# Patient Record
Sex: Female | Born: 1950 | Race: White | Hispanic: No | Marital: Single | Smoking: Never smoker
Health system: Southern US, Community
[De-identification: ages and names within clinical notes are randomized; demographics above are authoritative.]

## PROBLEM LIST (undated history)

## (undated) DIAGNOSIS — C801 Malignant (primary) neoplasm, unspecified: Secondary | ICD-10-CM

## (undated) DIAGNOSIS — E669 Obesity, unspecified: Secondary | ICD-10-CM

## (undated) HISTORY — DX: Malignant (primary) neoplasm, unspecified: C80.1

## (undated) HISTORY — PX: PORT-A-CATH REMOVAL: SHX5289

## (undated) HISTORY — DX: Obesity, unspecified: E66.9

---

## 2009-08-28 HISTORY — PX: PORTACATH PLACEMENT: SHX2246

## 2009-08-28 HISTORY — PX: BREAST SURGERY: SHX581

## 2010-07-01 ENCOUNTER — Ambulatory Visit: Payer: Self-pay | Admitting: Cardiology

## 2010-12-14 ENCOUNTER — Other Ambulatory Visit (HOSPITAL_COMMUNITY): Payer: Self-pay | Admitting: Internal Medicine

## 2010-12-14 DIAGNOSIS — C50911 Malignant neoplasm of unspecified site of right female breast: Secondary | ICD-10-CM

## 2010-12-16 ENCOUNTER — Ambulatory Visit
Admission: RE | Admit: 2010-12-16 | Discharge: 2010-12-16 | Disposition: A | Discharge status: Home / Self Care | Payer: PRIVATE HEALTH INSURANCE | Source: Ambulatory Visit | Attending: Internal Medicine | Admitting: Internal Medicine

## 2010-12-16 DIAGNOSIS — C50911 Malignant neoplasm of unspecified site of right female breast: Secondary | ICD-10-CM

## 2010-12-16 MED ORDER — GADOBENATE DIMEGLUMINE 529 MG/ML IV SOLN
20.0000 mL | Freq: Once | INTRAVENOUS | Status: AC | PRN
  Administered 2010-12-16: 20 mL via INTRAVENOUS

## 2010-12-22 ENCOUNTER — Other Ambulatory Visit (HOSPITAL_COMMUNITY): Payer: Self-pay | Admitting: General Surgery

## 2010-12-22 ENCOUNTER — Encounter (HOSPITAL_COMMUNITY)
Admission: RE | Admit: 2010-12-22 | Discharge: 2010-12-22 | Disposition: A | Discharge status: Home / Self Care | Payer: PRIVATE HEALTH INSURANCE | Source: Ambulatory Visit | Attending: General Surgery | Admitting: General Surgery

## 2010-12-22 ENCOUNTER — Ambulatory Visit (HOSPITAL_COMMUNITY)
Admission: RE | Admit: 2010-12-22 | Discharge: 2010-12-22 | Disposition: A | Discharge status: Home / Self Care | Payer: PRIVATE HEALTH INSURANCE | Source: Ambulatory Visit | Attending: General Surgery | Admitting: General Surgery

## 2010-12-22 DIAGNOSIS — C50919 Malignant neoplasm of unspecified site of unspecified female breast: Secondary | ICD-10-CM

## 2010-12-22 DIAGNOSIS — Z01812 Encounter for preprocedural laboratory examination: Secondary | ICD-10-CM | POA: Insufficient documentation

## 2010-12-22 DIAGNOSIS — Z01818 Encounter for other preprocedural examination: Secondary | ICD-10-CM | POA: Insufficient documentation

## 2010-12-22 DIAGNOSIS — Z01810 Encounter for preprocedural cardiovascular examination: Secondary | ICD-10-CM | POA: Insufficient documentation

## 2010-12-22 LAB — CBC
HCT: 36.4 % (ref 36.0–46.0)
Hemoglobin: 12.3 g/dL (ref 12.0–15.0)
MCHC: 33.8 g/dL (ref 30.0–36.0)
WBC: 9.8 10*3/uL (ref 4.0–10.5)

## 2010-12-22 LAB — COMPREHENSIVE METABOLIC PANEL
AST: 29 U/L (ref 0–37)
Albumin: 3.5 g/dL (ref 3.5–5.2)
Alkaline Phosphatase: 74 U/L (ref 39–117)
Chloride: 103 mEq/L (ref 96–112)
GFR calc Af Amer: >60 mL/min (ref >60)
Potassium: 4 mEq/L (ref 3.5–5.1)
Total Bilirubin: 0.7 mg/dL (ref 0.3–1.2)
Total Protein: 5.9 g/dL — ABNORMAL LOW (ref 6.0–8.3)

## 2010-12-22 LAB — DIFFERENTIAL
Basophils Absolute: 0 10*3/uL (ref 0.0–0.1)
Basophils Relative: 0 % (ref 0–1)
Lymphocytes Relative: 15 % (ref 12–46)
Neutro Abs: 7.6 10*3/uL (ref 1.7–7.7)
Neutrophils Relative %: 77 % (ref 43–77)

## 2010-12-22 LAB — URINALYSIS, ROUTINE W REFLEX MICROSCOPIC
Bilirubin Urine: NEGATIVE
Glucose, UA: NEGATIVE mg/dL
Ketones, ur: NEGATIVE mg/dL
Protein, ur: NEGATIVE mg/dL
pH: 7.5 (ref 5.0–8.0)

## 2010-12-22 LAB — URINE MICROSCOPIC-ADD ON

## 2010-12-22 LAB — SURGICAL PCR SCREEN: MRSA, PCR: NEGATIVE

## 2010-12-27 ENCOUNTER — Other Ambulatory Visit: Payer: Self-pay | Admitting: General Surgery

## 2010-12-27 ENCOUNTER — Observation Stay (HOSPITAL_COMMUNITY)
Admission: RE | Admit: 2010-12-27 | Discharge: 2010-12-28 | Disposition: A | Discharge status: Home / Self Care | Payer: PRIVATE HEALTH INSURANCE | Source: Ambulatory Visit | Attending: General Surgery | Admitting: General Surgery

## 2010-12-27 DIAGNOSIS — Z79899 Other long term (current) drug therapy: Secondary | ICD-10-CM | POA: Insufficient documentation

## 2010-12-27 DIAGNOSIS — C50019 Malignant neoplasm of nipple and areola, unspecified female breast: Principal | ICD-10-CM | POA: Insufficient documentation

## 2011-01-02 ENCOUNTER — Encounter (INDEPENDENT_AMBULATORY_CARE_PROVIDER_SITE_OTHER): Payer: Self-pay | Admitting: General Surgery

## 2011-01-02 NOTE — Op Note (Signed)
NAMEADREA, Miranda Parker               ACCOUNT NO.:  [id number]  MEDICAL RECORD NO.:  [id number]           PATIENT TYPE:  O  LOCATION:  5148                         FACILITY:  [facility]  PHYSICIAN:  Angelia Mould. Derrell Lolling, M.D.DATE OF BIRTH:  1950-11-08  DATE OF PROCEDURE:  12/27/2010 DATE OF DISCHARGE:                              OPERATIVE REPORT   PREOPERATIVE DIAGNOSES: 1. Invasive carcinoma, right breast, clinical stage T3N0. 2. Status post Port-A-Cath insertion.  POSTOPERATIVE DIAGNOSES: 1. Invasive carcinoma, right breast, clinical stage T3N0. 2. Status post Port-A-Cath insertion.  OPERATIONS PERFORMED: 1. Right modified radical mastectomy. 2. Removal of Port-A-Cath.  SURGEON:  Angelia Mould. Derrell Lolling, MD  FIRST ASSISTANT:  Anselm Pancoast. Zachery Dakins, MD  OPERATIVE INDICATIONS:  This is a 60 year old Caucasian female who states she felt a lump in her right breast for 3 years or so.  When the skin got red, she went to the emergency room in October 2011.  Cancer was suspected.  She saw Dr. Ubaldo Glassing.  Mammograms performed on June 14, 2010 showed an 8-cm right periareolar mass and some questionable shadowing at the 12 o'clock position on the left breast.  Bilateral image-guided biopsy showed invasive ductal carcinoma of the right breast which was ER/PR strongly positive and HER2 negative.  The left breast biopsy revealed no malignancy.  Port-A-Cath was placed up in [place] and she has undergone neoadjuvant chemotherapy.  The tumor has been down- staged to somewhat and the skin redness has gone away.  Dr. Ubaldo Glassing referred her to me.  On exam in the office recently, she had about a 6- cm mass centrally in the breast behind and just above the areola.  It had destroyed and invaded the superior half of the areola.  The skin looked healthy elsewhere.  There was no palpable mass in the left breast or in either axilla.  At the end of treatment, MRI showed a 6.3 cm area of enhancement in the right  breast corresponding to the known cancer. There was no abnormal adenopathy anywhere.  The left breast looked normal.  She was advised to proceed with definitive surgery and was brought to the operating room for a right modified radical mastectomy. We are also going to remove the Port-A-Cath since it is imbedded within the breast tissue and this has been discussed with Dr. Ubaldo Glassing.  OPERATIVE TECHNIQUE:  Following the induction of general endotracheal anesthesia, the patient's neck, chest, and lateral chest wall and arm and axilla were then prepped and draped in a sterile fashion. Intravenous antibiotics were given.  Surgical time-out was held identifying correct patient, correct procedure, and correct site.  I used a marking pen to mark a transverse elliptical incision taking generous skin margins both above and below.  The incision was made with a knife.  Skin flaps were raised superiorly to just below the clavicle, medially to the parasternal area, inferiorly to the inframammary crease and laterally to the platysmas dorsi muscle.  Superiorly, I encountered the port and was able to dissect the catheter free and removed that from the subclavian vein.  The port was deeply imbedded within the breast tissue and so I  simply left that in place.  The breast was dissected off the pectoralis major and minor muscles using electrocautery and dissecting from superior to inferior.  We were able to do this and leave the muscles intact.  The dissection was carried out laterally.  With retraction of the pectoralis major and minor muscles, we then incised the clavipectoral fascia and entered the axilla.  We then took the dissection up to the axillary vein which we identified.  We dissected all of the axillary tissue below the axillary vein.  There was some superficial venous tributaries which were controlled with metal clips and divided.  As we got deeper, there were a couple of sensory nerves which  were controlled with metal clips and divided.  When we got deeper, we identified the thoracodorsal neurovascular bundle.  This was preserved throughout the case and at the completion of the procedure the thoracodorsal nerve was functioning.  We swept all of the axillary contents out of the axilla en bloc, A, B, and C.  We were able to palpate and stimulate the long thoracic nerve and it seemed to be working at the completion of the case.  We completed the dissection of the axillary contents.  We marked the lateral skin margin with a silk suture.  We sent the breast and the axillary contents together as one specimen.  There were few other lymphatic tissues up in the axilla.  I dissected those out and sent those separately.  The wound was irrigated with saline.  Hemostasis was excellent and had been achieved with electrocautery and metal clips.  Two 19-French Blake drains were placed, one up across the skin flaps and one up into the axilla and these were brought out through separate stab incisions inferolaterally, sutured to the skin with nylon sutures and connected to suction bulbs.  The skin was healthy and was closed with skin staples.  A clean occlusive bandage was placed and the breast binder was placed and the patient taken to the recovery room in stable condition.  Estimated blood loss was about 100- 150 mL.  Complications none.  Sponge, needle, and instrument counts were correct.     Angelia Mould. Derrell Lolling, M.D.     HMI/MEDQ  D:  12/27/2010  T:  12/27/2010  Job:  [id number]  cc:   Lebron Conners. Darovsky, M.D.  Electronically Signed by Claud Kelp M.D. on 01/02/2011 06:40:59 PM

## 2011-02-07 ENCOUNTER — Ambulatory Visit (HOSPITAL_COMMUNITY)
Admission: RE | Admit: 2011-02-07 | Discharge: 2011-02-07 | Disposition: A | Discharge status: Home / Self Care | Payer: PRIVATE HEALTH INSURANCE | Source: Ambulatory Visit | Attending: General Surgery | Admitting: General Surgery

## 2011-02-07 DIAGNOSIS — M7989 Other specified soft tissue disorders: Secondary | ICD-10-CM | POA: Insufficient documentation

## 2011-02-22 ENCOUNTER — Ambulatory Visit: Payer: PRIVATE HEALTH INSURANCE | Attending: General Surgery | Admitting: Physical Therapy

## 2011-02-22 DIAGNOSIS — I890 Lymphedema, not elsewhere classified: Secondary | ICD-10-CM | POA: Insufficient documentation

## 2011-02-22 DIAGNOSIS — IMO0001 Reserved for inherently not codable concepts without codable children: Secondary | ICD-10-CM | POA: Insufficient documentation

## 2011-02-27 ENCOUNTER — Ambulatory Visit: Payer: PRIVATE HEALTH INSURANCE | Attending: General Surgery | Admitting: Physical Therapy

## 2011-02-27 DIAGNOSIS — I890 Lymphedema, not elsewhere classified: Secondary | ICD-10-CM | POA: Insufficient documentation

## 2011-02-27 DIAGNOSIS — IMO0001 Reserved for inherently not codable concepts without codable children: Secondary | ICD-10-CM | POA: Insufficient documentation

## 2011-03-02 ENCOUNTER — Ambulatory Visit: Payer: PRIVATE HEALTH INSURANCE | Admitting: Physical Therapy

## 2011-03-07 ENCOUNTER — Ambulatory Visit: Payer: PRIVATE HEALTH INSURANCE | Admitting: Physical Therapy

## 2011-03-09 ENCOUNTER — Ambulatory Visit: Payer: PRIVATE HEALTH INSURANCE | Admitting: Physical Therapy

## 2011-03-13 ENCOUNTER — Encounter (INDEPENDENT_AMBULATORY_CARE_PROVIDER_SITE_OTHER): Payer: Self-pay | Admitting: General Surgery

## 2011-03-13 ENCOUNTER — Ambulatory Visit (INDEPENDENT_AMBULATORY_CARE_PROVIDER_SITE_OTHER): Payer: PRIVATE HEALTH INSURANCE | Admitting: General Surgery

## 2011-03-13 DIAGNOSIS — C50919 Malignant neoplasm of unspecified site of unspecified female breast: Secondary | ICD-10-CM

## 2011-03-13 DIAGNOSIS — C50911 Malignant neoplasm of unspecified site of right female breast: Secondary | ICD-10-CM

## 2011-03-13 NOTE — Progress Notes (Signed)
Subjective:     Patient ID: Miranda Parker, female   DOB: 1951-07-26, 60 y.o.   MRN: [id number]  HPI Patient returns for followup. She . The pathology report shows invasive cancer the overlying skin, and cancer being present in dermal lymphatics. The stages is ypT2, ypN0.  She is starting radiation therapy under the guidance of Dr. Roselind Messier today.She has had no problems with wound healing. She has good range of motion of her right shoulder. Her venous Doppler of the right upper extremity was negative. She has been seen in the lymphedema clinic. I signed a prescription for a lymphedema sleeve but she says the swelling is getting better and she may not use it.  Review of Systems  HENT: Positive for tinnitus.    Review of Systems  HENT: Positive for tinnitus.       Objective:   Physical Exam The patient looks well. She is in good spirits.  Neck reveals no adenopathy or mass.  Chest reveals clear lung fields. The Port-A-Cath incision in the right infraclavicular area is well healed.  Breasts the right mastectomy incision is well healed. Tissues are soft. There is no fluid collection, ulceration, or nodules. The right axilla feels normal.  Right arm swelling is less and is nontender without any sign of infection.    Assessment:     1. Locally advanced cancer of the right breast involving dermal lymphatics and areola, status post neoadjuvant chemotherapy and subsequent right modified radical mastectomy with negative margins and negative nodes.  2. Lymphedema right upper extremity, improving spontaneously.  3. No evidence of deep venous thrombosis right upper extremity      Plan:     Proceed with adjuvant radiation therapy.  Return to see me in 4-5 months for physical exam.  Plan left breast mammogram in April 2013.

## 2011-03-13 NOTE — Patient Instructions (Signed)
Your wounds look well healed today. I think that you should continue with your radiation therapy as outlined by Dr. Roselind Messier  Return to see me in 4-5 months for a physical exam.  You should get a left mammogram in April 2013.

## 2011-03-14 ENCOUNTER — Ambulatory Visit: Payer: PRIVATE HEALTH INSURANCE | Admitting: Physical Therapy

## 2011-04-12 ENCOUNTER — Ambulatory Visit: Payer: PRIVATE HEALTH INSURANCE | Attending: General Surgery | Admitting: Physical Therapy

## 2011-04-12 DIAGNOSIS — IMO0001 Reserved for inherently not codable concepts without codable children: Secondary | ICD-10-CM | POA: Insufficient documentation

## 2011-04-12 DIAGNOSIS — I890 Lymphedema, not elsewhere classified: Secondary | ICD-10-CM | POA: Insufficient documentation

## 2011-07-24 ENCOUNTER — Ambulatory Visit (INDEPENDENT_AMBULATORY_CARE_PROVIDER_SITE_OTHER): Payer: PRIVATE HEALTH INSURANCE | Admitting: General Surgery

## 2011-07-24 ENCOUNTER — Encounter (INDEPENDENT_AMBULATORY_CARE_PROVIDER_SITE_OTHER): Payer: Self-pay | Admitting: General Surgery

## 2011-07-24 VITALS — BP 146/90 | HR 72 | Temp 98.7°F | Resp 18 | Ht 64.0 in | Wt 283.2 lb

## 2011-07-24 DIAGNOSIS — Z853 Personal history of malignant neoplasm of breast: Secondary | ICD-10-CM

## 2011-07-25 NOTE — Progress Notes (Signed)
Patient ID: Miranda Parker, female   DOB: [birth year], 60 y.o.   MRN: [id number]  Chief Complaint  Patient presents with  . Breast Cancer Long Term Follow Up    recheck mastectomy     HPI Miranda Parker is a 60 y.o. female.    She underwent right modified radical mastectomy and removal of port-a cath on Dec 27, 2010. Prior to the surgery she underwent neoadjuvant chemotherapy. Her pathology report showed negative margins with the mastectomy. She had invasive moderately differentiated ductal carcinoma, 5 cm diameter tumor with involvement of the overlying skin. Margins were negative. 15 total lymph nodes were evaluated and all were negative for tumor. Pathologically this was a T2, N0 tumor with strongly positive receptors, Ki-67 40%, and negative HER-2.  She still has some right arm swelling which is being managed by lymphedema sleeve and glove. Ultrasound evaluation back in July was negative for deep venous thrombosis. She is basically feeling well. She has excellent range of motion of her right shoulder. She has no pain.  She is on a room in excellent tolerating a well. She is followed by Dr. Tennis Must [facility] and Dr. Antony Blackbird. HPI  Past Medical History  Diagnosis Date  . Cancer   . Obesity     Past Surgical History  Procedure Date  . Breast surgery 2011    BIOPSY  . Portacath placement 2011  . Port-a-cath removal     History reviewed. No pertinent family history.  Social History History  Substance Use Topics  . Smoking status: Never Smoker   . Smokeless tobacco: Never Used  . Alcohol Use: No    No Known Allergies  Current Outpatient Prescriptions  Medication Sig Dispense Refill  . anastrozole (ARIMIDEX) 1 MG tablet Take 1 mg by mouth daily.        . calcium carbonate (OS-CAL) 600 MG TABS Take 1,200 mg by mouth daily.        Marland Kitchen CALCIUM PO Take by mouth.        . Cholecalciferol ([employer]S VIT D 5000 HIGH-POTENCY PO) Take 2,000 mg by mouth daily.          Review of  Systems Review of Systems 10 system review of systems is negative except as described above. Blood pressure 146/90, pulse 72, temperature 98.7 F (37.1 C), temperature source Temporal, resp. rate 18, height 5\' 4"  (1.626 m), weight 283 lb 4 oz (128.481 kg).  Physical Exam Physical Exam  Constitutional: She appears well-developed and well-nourished. No distress.       Obese, 283 lbs.  Eyes: Pupils are equal, round, and reactive to light. No scleral icterus.  Neck: Normal range of motion. Neck supple. No JVD present. No thyromegaly present.  Cardiovascular: Normal rate, regular rhythm and normal heart sounds.   Pulmonary/Chest: Effort normal and breath sounds normal. No respiratory distress. She has no wheezes. She has no rales. She exhibits no tenderness.         Port wound well healed.  Musculoskeletal: Normal range of motion. She exhibits edema.       2/5 RUE lymphedema  Lymphadenopathy:    She has no cervical adenopathy.  Skin: Skin is warm and dry. No rash noted. No erythema.  Psychiatric: She has a normal mood and affect. Her behavior is normal. Judgment and thought content normal.    Data Reviewed I reviewed all of my old records, all the old pathology reports, and all the notes from the [facility]. Assessment  Locally advanced cancer of the right breast,status post neoadjuvant chemotherapy and subsequent right modified radical mastectomy No evidence of recurrence 6 months postop  Pathologic stage ypT2, ypN0, receptor positive, HER-2 negativeRight upper extremity lymphedema, fairly well-controlled with sleeve and gloved.  Morbid obesity      Plan    Continue close clinical followup with Dr. Earma Reading.  Continue arimidex  Left breast mammogram in April, 2013  Return to see me in May 2013.       Ernestene Mention 07/25/2011, 6:24 PM (patient seen 07-24-2011)

## 2011-07-25 NOTE — Patient Instructions (Signed)
Your exam today is good. There is no evidence of recurrent cancer. You still have lymphedema in the right arm.  Continue to use the sleeve and glove.  Be sure to get your left breast mammogram in April 2013. Return to see me in May of 2013 for a followup visit.

## 2012-01-26 ENCOUNTER — Encounter (INDEPENDENT_AMBULATORY_CARE_PROVIDER_SITE_OTHER): Payer: Self-pay | Admitting: General Surgery

## 2012-01-26 ENCOUNTER — Ambulatory Visit (INDEPENDENT_AMBULATORY_CARE_PROVIDER_SITE_OTHER): Payer: PRIVATE HEALTH INSURANCE | Admitting: General Surgery

## 2012-01-26 VITALS — BP 152/96 | HR 100 | Temp 98.2°F | Resp 14 | Ht 64.0 in | Wt 281.4 lb

## 2012-01-26 DIAGNOSIS — C50911 Malignant neoplasm of unspecified site of right female breast: Secondary | ICD-10-CM

## 2012-01-26 DIAGNOSIS — C50919 Malignant neoplasm of unspecified site of unspecified female breast: Secondary | ICD-10-CM

## 2012-01-26 NOTE — Patient Instructions (Signed)
Your physical exam today shows normal healing of your right mastectomy wound, and no evidence of cancer. Your left breast exam is also normal. Your mammograms that were performed on Jan 02, 2012 show a normal left breast.  Keep your regular appointment with Dr. Ubaldo Glassing  Return to see Dr. Derrell Lolling in one year.

## 2012-01-26 NOTE — Progress Notes (Signed)
Patient ID: Miranda Parker, female   DOB: 24-Jun-1951, 61 y.o.   MRN: [id number]  Chief Complaint  Patient presents with  . Breast Cancer Long Term Follow Up    Yrly br check    HPI Miranda Parker is a 61 y.o. female.  She returns for long-term followup of her right breast cancer.  She presented last year with a right breast cancer. This was locally advanced. I inserted a Port-A-Cath. She underwent neoadjuvant chemotherapy.  On Dec 27, 2010 she underwent a right modified radical mastectomy and removal of the Port-A-Cath. She had a 5 cm invasive ductal carcinoma. The cancer was involving the skin but the margins were negative. 0/15 nodes were positive. Receptor positive. HER-2 negative. Ki-67 40%. This was dictated as a T2 N0 tumor.  She has done fairly well. She is working full-time in an Scientist, research (physical sciences). She says her  right arm swelling persists but is not as bad as she's not using the sleeve anymore.  She had postop radiation therapy with Dr. Roselind Messier and she did well with that. She has been discharged about how to care  Left breast mammogram performed on Dec 29, 2011 looks normal, category one.  She has no other complaints. HPI  Past Medical History  Diagnosis Date  . Cancer   . Obesity     Past Surgical History  Procedure Date  . Breast surgery 2011    BIOPSY  . Portacath placement 2011  . Port-a-cath removal     History reviewed. No pertinent family history.  Social History History  Substance Use Topics  . Smoking status: Never Smoker   . Smokeless tobacco: Never Used  . Alcohol Use: No    No Known Allergies  Current Outpatient Prescriptions  Medication Sig Dispense Refill  . anastrozole (ARIMIDEX) 1 MG tablet Take 1 mg by mouth daily.        . calcium carbonate (OS-CAL) 600 MG TABS Take 1,200 mg by mouth daily.        Marland Kitchen CALCIUM PO Take by mouth.        . Cholecalciferol (CVS VIT D 5000 HIGH-POTENCY PO) Take 2,000 mg by mouth daily.          Review of  Systems Review of Systems  Constitutional: Negative for fever, chills and unexpected weight change.  HENT: Negative for hearing loss, congestion, sore throat, trouble swallowing and voice change.   Eyes: Negative for visual disturbance.  Respiratory: Negative for cough and wheezing.   Cardiovascular: Negative for chest pain, palpitations and leg swelling.  Gastrointestinal: Negative for nausea, vomiting, abdominal pain, diarrhea, constipation, blood in stool, abdominal distention and anal bleeding.  Genitourinary: Negative for hematuria, vaginal bleeding and difficulty urinating.  Musculoskeletal: Negative for arthralgias.  Skin: Negative for rash and wound.  Neurological: Negative for seizures, syncope and headaches.  Hematological: Negative for adenopathy. Does not bruise/bleed easily.  Psychiatric/Behavioral: Negative for confusion.    Blood pressure 152/96, pulse 100, temperature 98.2 F (36.8 C), temperature source Temporal, resp. rate 14, height 5\' 4"  (1.626 m), weight 281 lb 6.4 oz (127.642 kg).  Physical Exam Physical Exam  Constitutional: She is oriented to person, place, and time. She appears well-developed and well-nourished. No distress.       BMI 49.30  HENT:  Head: Normocephalic and atraumatic.  Eyes: EOM are normal.  Neck: Neck supple. No JVD present. No tracheal deviation present. No thyromegaly present.  Cardiovascular: Normal rate, regular rhythm, normal heart sounds and intact distal pulses.  No murmur heard. Pulmonary/Chest: Effort normal and breath sounds normal. No respiratory distress. She has no wheezes. She has no rales. She exhibits no tenderness.       Right mastectomy scar well healed. No nodules or ulceration. No axillary mass. 100% range of motion right shoulder. Mild right upper extremity swelling and edema. Left breast is moderately large. Soft. No skin changes, no mass, no axillary adenopathy.  Abdominal: Bowel sounds are normal.  Musculoskeletal: She  exhibits no edema and no tenderness.  Lymphadenopathy:    She has no cervical adenopathy.  Neurological: She is alert and oriented to person, place, and time. She exhibits normal muscle tone. Coordination normal.  Skin: Skin is warm. No rash noted. She is not diaphoretic. No erythema. No pallor.  Psychiatric: She has a normal mood and affect. Her behavior is normal. Judgment and thought content normal.    Data Reviewed  Recent MGM's and doctor's notes from [place]. Assessment    Invasive ductal carcinoma right breast, status post neoadjuvant chemotherapy and subsequent right modified radical mastectomy on Dec 27, 2010. 5 cm tumor. Negative nodes. Negative margins. Receptor positive. HER-2 negative. Ki67 40%  No evidence of recurrence at one year    Plan    She will see Dr. Ubaldo Glassing in December 2013. She will alternate appointment  with him and with me, so we'll see her in June of 2014  Mammograms in May 2014.       Angelia Mould. Derrell Lolling, M.D., [facility] General and Minimally invasive Surgery Breast and Colorectal Surgery Office:   [phone] Pager:   [phone]  01/26/2012, 3:26 PM

## 2012-03-14 ENCOUNTER — Encounter (INDEPENDENT_AMBULATORY_CARE_PROVIDER_SITE_OTHER): Payer: Self-pay

## 2012-08-07 ENCOUNTER — Encounter: Payer: PRIVATE HEALTH INSURANCE | Admitting: Internal Medicine

## 2012-08-07 DIAGNOSIS — C50919 Malignant neoplasm of unspecified site of unspecified female breast: Secondary | ICD-10-CM

## 2012-08-07 DIAGNOSIS — E669 Obesity, unspecified: Secondary | ICD-10-CM

## 2012-08-07 DIAGNOSIS — M899 Disorder of bone, unspecified: Secondary | ICD-10-CM

## 2012-08-07 DIAGNOSIS — M949 Disorder of cartilage, unspecified: Secondary | ICD-10-CM

## 2013-02-21 ENCOUNTER — Encounter (INDEPENDENT_AMBULATORY_CARE_PROVIDER_SITE_OTHER): Payer: Self-pay | Admitting: General Surgery

## 2013-02-21 ENCOUNTER — Ambulatory Visit (INDEPENDENT_AMBULATORY_CARE_PROVIDER_SITE_OTHER): Payer: BC Managed Care – PPO | Admitting: General Surgery

## 2013-02-21 VITALS — BP 136/90 | HR 82 | Temp 97.6°F | Resp 16 | Ht 64.0 in | Wt 286.0 lb

## 2013-02-21 DIAGNOSIS — C50911 Malignant neoplasm of unspecified site of right female breast: Secondary | ICD-10-CM

## 2013-02-21 DIAGNOSIS — C50919 Malignant neoplasm of unspecified site of unspecified female breast: Secondary | ICD-10-CM

## 2013-02-21 NOTE — Patient Instructions (Signed)
Examination of the right mastectomy wound, chest wall, left breast, and all the lymph nodes is normal. There is no evidence of cancer.  Continue taking the arimadex  Keep your appointment with medical oncologist in [place]  in December  Repeat mammograms of the left breast in May, 2015  Return to see Dr. Derrell Lolling in June, 2015.

## 2013-02-21 NOTE — Progress Notes (Signed)
Patient ID: Miranda Parker, female   DOB: 21-Apr-1951, 62 y.o.   MRN: [id number] History:   Miranda Parker is a 62 y.o. female. She returns for long-term followup of her right breast cancer.  She presented in 2012 with a right breast cancer. This was locally advanced. I inserted a Port-A-Cath. She underwent neoadjuvant chemotherapy.  On Dec 27, 2010 she underwent a right modified radical mastectomy and removal of the Port-A-Cath. She had a 5 cm invasive ductal carcinoma. The cancer was involving the skin but the margins were negative. 0/15 nodes were positive. Receptor positive. HER-2 negative. Ki-67 40%. This was dictated as a T2 N0 tumor.  She has done fairly well. She is working full-time in an Scientist, research (physical sciences). She says her right arm swelling is mild but  persists but is not as bad as she's not using the sleeve anymore.  She had postop radiation therapy with Dr. Roselind Messier and she did well with that.   Left breast mammogram performed on Jan 08, 2013 looks normal, category one.  She has no other complaints.   ROS:  10 system review of systems reveals tendinitis in the right forearm. Arthralgias and myalgias. Minor numbness of the right upper arm.Otherwise negative  Exam: Alert. No distress. Central obesity Neck no adenopathy or mass Lungs clear auscultation bilaterally Heart regular rate and rhythm. No murmur. No ectopy Breast right mastectomy scar well healed. Tissues are soft. No nodules or ulceration. Axilla negative. Minimal right arm swelling. Left breast is large. Soft. No skin change. No mass. No axillary adenopathy.  Assessment: Invasive ductal carcinoma right breast, status post neoadjuvant chemotherapy and subsequent right modified radical mastectomy on 12/27/2010. 5 cm tumor. Negative nodes. Negative margins. Her Septra positive. HER-2 negative. Ki 67 40% No evidence of recurrence at 2 years Obesity  Plan continue arimadex, calcium, vitamin D See Medical oncologist in [place] in  December Left mammogram May 2015 See me June 2015.    Miranda Parker. Derrell Lolling, M.D., [facility] General and Minimally invasive Surgery Breast and Colorectal Surgery Office:   [phone] Pager:   [phone]

## 2013-03-04 ENCOUNTER — Other Ambulatory Visit: Payer: Self-pay | Admitting: Nurse Practitioner

## 2013-07-03 ENCOUNTER — Other Ambulatory Visit: Payer: Self-pay

## 2013-07-14 DIAGNOSIS — M899 Disorder of bone, unspecified: Secondary | ICD-10-CM

## 2013-07-14 DIAGNOSIS — D751 Secondary polycythemia: Secondary | ICD-10-CM

## 2013-07-14 DIAGNOSIS — C50919 Malignant neoplasm of unspecified site of unspecified female breast: Secondary | ICD-10-CM

## 2013-07-14 DIAGNOSIS — M949 Disorder of cartilage, unspecified: Secondary | ICD-10-CM

## 2014-03-06 ENCOUNTER — Ambulatory Visit (INDEPENDENT_AMBULATORY_CARE_PROVIDER_SITE_OTHER): Payer: BC Managed Care – PPO | Admitting: General Surgery

## 2014-03-06 ENCOUNTER — Encounter (INDEPENDENT_AMBULATORY_CARE_PROVIDER_SITE_OTHER): Payer: Self-pay | Admitting: General Surgery

## 2014-03-06 DIAGNOSIS — C50919 Malignant neoplasm of unspecified site of unspecified female breast: Secondary | ICD-10-CM

## 2014-03-06 DIAGNOSIS — C50911 Malignant neoplasm of unspecified site of right female breast: Secondary | ICD-10-CM

## 2014-03-06 NOTE — Progress Notes (Signed)
Patient ID: Miranda Parker, female   DOB: Sep 21, 1950, 63 y.o.   MRN: [id number]   History:  Miranda Parker is a 63 y.o. female. She returns for long-term followup of her right breast cancer.  She presented in 2012 with a right breast cancer. This was locally advanced.  She underwent neoadjuvant chemotherapy.  On Dec 27, 2010 she underwent a right modified radical mastectomy and removal of the Port-A-Cath. She had a 5 cm invasive ductal carcinoma. The cancer was involving the skin but the margins were negative. 0/15 nodes were positive. Receptor positive. HER-2 negative. Ki-67 40%. This was dictated as a T2 N0 tumor.  She has done fairly well. She is working full-time in an [employer]. She says her right arm swelling is mild but persists but is not as bad as she's not using the sleeve anymore.  She had postop radiation therapy with Dr. Sondra Come and she did well with that.  Left breast mammogram performed on May, 2015 in [place] is reportedly normal, category one.  She is going to start seeing Marco Collie on a regular basis. She has no other complaints.   ROS: 10 system review of systems reveals tendinitis in the right forearm. Arthralgias and myalgias. Minor numbness of the right upper arm.Otherwise negative   Exam: Alert. No distress. Central obesity  Neck no adenopathy or mass  Lungs clear auscultation bilaterally  Heart regular rate and rhythm. No murmur. No ectopy  Breast right mastectomy scar well healed. Tissues are soft. No nodules or ulceration. Axilla negative. Minimal right arm swelling. Left breast is large. Soft. No skin change. No mass. No axillary adenopathy.   Assessment: Invasive ductal carcinoma right breast, status post neoadjuvant chemotherapy and subsequent right modified radical mastectomy on 12/27/2010. 5 cm tumor. Negative nodes. Negative margins. Her Septra positive. HER-2 negative. Ki 67 40%  No evidence of recurrence at 3 years  Obesity   Plan continue arimadex,  calcium, vitamin D  See Dr. Tressie Stalker  in [place] Left mammogram May 2016 See me June 2016.     Edsel Petrin. Dalbert Batman, M.D., [facility]  General and Minimally invasive Surgery  Breast and Colorectal Surgery  Office: [phone]  Pager: [phone]

## 2014-03-06 NOTE — Patient Instructions (Signed)
Examination of your right mastectomy wound, left breast, and all the regional lymph nodes is normal. There is no evidence of cancer.  Reportedly, your mammograms that were done in [place] in [place] also normal  Continue taking arimidex, calcium, and vitamin D  Be sure to start seeing Dr. Tressie Stalker on a regular basis for medical oncology followup  Be sure to get your mammograms annually  Return to see Dr. Dalbert Batman in one year

## 2014-04-30 ENCOUNTER — Encounter: Payer: Self-pay | Admitting: General Surgery

## 2020-09-20 ENCOUNTER — Other Ambulatory Visit: Payer: Self-pay

## 2020-09-20 ENCOUNTER — Emergency Department (HOSPITAL_COMMUNITY): Payer: Medicare HMO

## 2020-09-20 ENCOUNTER — Emergency Department (HOSPITAL_COMMUNITY)
Admission: EM | Admit: 2020-09-20 | Discharge: 2020-09-21 | Disposition: A | Discharge status: Home / Self Care | Payer: Medicare HMO | Attending: Emergency Medicine | Admitting: Emergency Medicine

## 2020-09-20 ENCOUNTER — Encounter (HOSPITAL_COMMUNITY): Payer: Self-pay | Admitting: Emergency Medicine

## 2020-09-20 DIAGNOSIS — W19XXXA Unspecified fall, initial encounter: Secondary | ICD-10-CM

## 2020-09-20 DIAGNOSIS — W01198A Fall on same level from slipping, tripping and stumbling with subsequent striking against other object, initial encounter: Secondary | ICD-10-CM | POA: Insufficient documentation

## 2020-09-20 DIAGNOSIS — Z853 Personal history of malignant neoplasm of breast: Secondary | ICD-10-CM | POA: Diagnosis not present

## 2020-09-20 DIAGNOSIS — S02832A Fracture of medial orbital wall, left side, initial encounter for closed fracture: Secondary | ICD-10-CM | POA: Diagnosis not present

## 2020-09-20 DIAGNOSIS — S0083XA Contusion of other part of head, initial encounter: Secondary | ICD-10-CM

## 2020-09-20 DIAGNOSIS — S0993XA Unspecified injury of face, initial encounter: Secondary | ICD-10-CM | POA: Diagnosis present

## 2020-09-20 MED ORDER — AMOXICILLIN-POT CLAVULANATE 875-125 MG PO TABS: 1.0000 | ORAL_TABLET | Freq: Two times a day (BID) | ORAL | 0 refills | Status: AC

## 2020-09-20 MED ORDER — AMOXICILLIN-POT CLAVULANATE 500-125 MG PO TABS
1.0000 | ORAL_TABLET | Freq: Once | ORAL | Status: AC
  Administered 2020-09-21: 500 mg via ORAL
  Filled 2020-09-20: qty 1

## 2020-09-20 NOTE — ED Triage Notes (Signed)
Pt states she fell and hit her head on her desk. States she felt fine at work, went home, and blew her nose and her left eye immediately swelled.

## 2020-09-20 NOTE — ED Provider Notes (Signed)
[facility] EMERGENCY DEPARTMENT Provider Note   CSN: [id number] Arrival date & time: 09/20/20  1957   Time seen 11:45 PM  History Chief Complaint  Patient presents with  . Fall    Miranda Parker is a 70 y.o. female.  HPI   Patient states today at work around 2 PM she walked into her office she tripped on the threshold and fell onto her desk hitting the desk with her nose and left face.  She states she felt fine and finished her job and went shopping after work and went home and [employer].  She states about 7 PM she blew her nose and she had immediate swelling of the left eyelids.  She denies having any pain.  She denies any blurred vision or visual disturbance.  She did not have loss of consciousness.  PCP Aura Dials, PA-C Optometry Dr Marin Comment at [facility] in [place]  Past Medical History:  Diagnosis Date  . Cancer ([place])   . Obesity     Patient Active Problem List   Diagnosis Date Noted  . Cancer of right breast ([place]) 02/21/2013    Past Surgical History:  Procedure Laterality Date  . BREAST SURGERY  2011   BIOPSY  . PORT-A-CATH REMOVAL    . PORTACATH PLACEMENT  2011     OB History   No obstetric history on file.     No family history on file.  Social History   Tobacco Use  . Smoking status: Never Smoker  . Smokeless tobacco: Never Used  Substance Use Topics  . Alcohol use: No  . Drug use: No  employed  Home Medications Prior to Admission medications   Medication Sig Start Date End Date Taking? Authorizing Provider  amoxicillin-clavulanate (AUGMENTIN) 875-125 MG tablet Take 1 tablet by mouth every 12 (twelve) hours. 09/20/20  Yes Rolland Porter, MD  alendronate (FOSAMAX) 70 MG tablet Take 70 mg by mouth once a week. Take with a full glass of water on an empty stomach.    [provider]  anastrozole (ARIMIDEX) 1 MG tablet Take 1 mg by mouth daily.      [provider]  calcium carbonate (OS-CAL) 600 MG TABS Take 1,200 mg by mouth daily.       [provider]  Calcium Carbonate-Vit D-Min (CALCIUM 1200 PO) Take by mouth.    [provider]  CALCIUM PO Take by mouth.      [provider]  Cholecalciferol (CVS VIT D 5000 HIGH-POTENCY PO) Take 2,000 mg by mouth daily.      [provider]  lisinopril-hydrochlorothiazide (PRINZIDE,ZESTORETIC) 10-12.5 MG per tablet Take 1 tablet by mouth daily.    [provider]  LOSARTAN POTASSIUM-HCTZ PO Take 100 mg by mouth.    [provider]  Omega-3 Fatty Acids (FISH OIL) 1200 MG CAPS Take by mouth.    [provider]  simvastatin (ZOCOR) 20 MG tablet Take 20 mg by mouth daily.    [provider]  spironolactone (ALDACTONE) 100 MG tablet Take 1,200 mg by mouth daily.    [provider]    Allergies    Patient has no known allergies.  Review of Systems   Review of Systems  All other systems reviewed and are negative.   Physical Exam Updated Vital Signs BP (!) 193/78   Pulse 87   Temp 97.6 F (36.4 C) (Oral)   Resp 18   Ht 5\' 3"  (1.6 m)   Wt 120.2 kg  SpO2 100%   BMI 46.94 kg/m   Physical Exam Vitals and nursing note reviewed.  Constitutional:      General: She is not in acute distress.    Appearance: Normal appearance. She is obese. She is not ill-appearing or toxic-appearing.  HENT:     Head: Normocephalic.     Comments: There is a very superficial small abrasion on the bridge of her nose and a small area of bruising on her left cheek.  She states that is what she hit on the desk.    Right Ear: External ear normal.     Left Ear: External ear normal.  Eyes:     Extraocular Movements: Extraocular movements intact.     Conjunctiva/sclera: Conjunctivae normal.     Pupils: Pupils are equal, round, and reactive to light.     Comments: Patient has marked swelling especially of her left upper eyelid that appears soft, there is no redness to the skin.  Her extraocular muscles are intact and she denies  any double vision.  Cardiovascular:     Rate and Rhythm: Normal rate.  Pulmonary:     Effort: Pulmonary effort is normal. No respiratory distress.  Musculoskeletal:        General: Normal range of motion.     Cervical back: Normal range of motion.  Skin:    General: Skin is warm and dry.  Neurological:     General: No focal deficit present.     Mental Status: She is alert and oriented to person, place, and time.     Cranial Nerves: No cranial nerve deficit.  Psychiatric:        Mood and Affect: Mood normal.        Behavior: Behavior normal.        Thought Content: Thought content normal.       ED Results / Procedures / Treatments   Labs (all labs ordered are listed, but only abnormal results are displayed) Labs Reviewed - No data to display  EKG None  Radiology CT Head Wo Contrast   CT Maxillofacial Wo Contrast  Result Date: 09/20/2020 CLINICAL DATA:  70 year old female with head trauma. EXAM: CT HEAD WITHOUT CONTRAST CT MAXILLOFACIAL WITHOUT CONTRAST TECHNIQUE: Multidetector CT imaging of the head and maxillofacial structures were performed using the standard protocol without intravenous contrast. Multiplanar CT image reconstructions of the maxillofacial structures were also generated. COMPARISON:  None FINDINGS: CT HEAD FINDINGS Brain: The ventricles and sulci appropriate size for patient's age. The gray-white matter discrimination is preserved. There is no acute intracranial hemorrhage. No mass effect or midline shift. No extra-axial fluid collection. Vascular: No hyperdense vessel or unexpected calcification. Skull: Normal. Negative for fracture or focal lesion. 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 No other acute fracture. No mandibular subluxation. Orbits: The globes are intact. 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 There is abutment of the medial rectus muscle of the left orbit to the fracture of the lamina  Propecia. Correlation with clinical exam is recommended to exclude ocular entrapment. Sinuses: Partial opacification of the left maxillary sinus, likely hemosinus. There is mild diffuse mucoperiosteal thickening of paranasal sinuses. The mastoid air cells are clear. Soft tissues: Left periorbital contusion. No fluid collection or large hematoma. IMPRESSION: 1. No acute intracranial pathology. 2. 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 Correlation with clinical exam is recommended to exclude ocular entrapment. Electronically Signed   By: Anner Crete M.D.   On: 09/20/2020 20:41  Procedures Procedures   Medications Ordered in ED Medications  amoxicillin-clavulanate (AUGMENTIN) 500-125 MG per tablet 500 mg (has no administration in time range)    ED Course  I have reviewed the triage vital signs and the nursing notes.  Pertinent labs & imaging results that were available during my care of the patient were reviewed by me and considered in my medical decision making (see chart for details).    MDM Rules/Calculators/A&P                         Due to the blood in the left maxillary sinus she was started on Augmentin to help prevent orbital cellulitis.  She was referred to ophthalmology and ENT.  We discussed reasons to return to the ED such as fever, redness or pain of the eyelids, blurred or double vision.  Also any symptoms listed on the head injury sheet.   Final Clinical Impression(s) / ED Diagnoses Final diagnoses:  Contusion of face, initial encounter  Fall, initial encounter  Closed fracture of medial wall of left orbit, initial encounter [facility])    Rx / DC Orders ED Discharge Orders         Ordered    amoxicillin-clavulanate (AUGMENTIN) 875-125 MG tablet  Every 12 hours        09/20/20 2358         Plan discharge  Rolland Porter, MD, Barbette Or, MD 09/21/20 [phone]

## 2020-09-21 NOTE — Discharge Instructions (Addendum)
Take the antibiotics as prescribed.  Return to the emergency room if you get fever, you get pain, intense redness of the eyelids, or you get blurred or double vision.  Otherwise please call either Dr. Benjamine Mola or Dr Marcelline Deist, ENT and Dr Kathlen Mody, Ophthalmology, to be evaluated for your "depressed fracture of the left lamina Propecia" or fracture of the medial orbital wall/ethmoid sinus. You also had blood in the left maxillary sinus.  Return to the ED for any problems on the head injury sheet.  DO NOT BLOW YOUR NOSE AGAIN!!

## 2022-08-30 IMAGING — CT CT MAXILLOFACIAL W/O CM
3 of 4 series · 15 of 47 positions shown, 18 images · non-contrast
Comparison: None

CLINICAL DATA: 69-year-old female with head trauma.

EXAM:
CT HEAD WITHOUT CONTRAST
CT MAXILLOFACIAL WITHOUT CONTRAST
TECHNIQUE: Multidetector CT imaging of the head and maxillofacial structures
were performed using the standard protocol without intravenous
contrast. Multiplanar CT image reconstructions of the maxillofacial
structures were also generated.

[Series 2: max soft · axial · 0.36mm/px · z∈[-22,+130]mm · 9 of 90 slices shown, 12 images]
[im 7/90  brain]
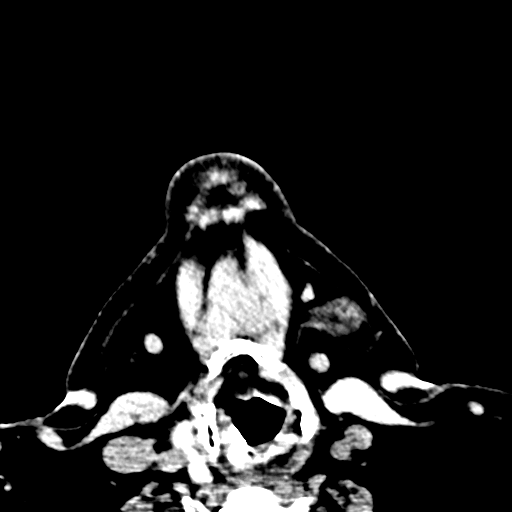
[im 7/90  bone]
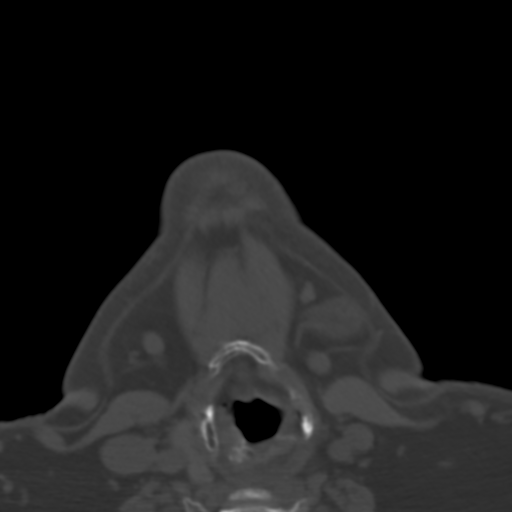
[im 16/90  bone]
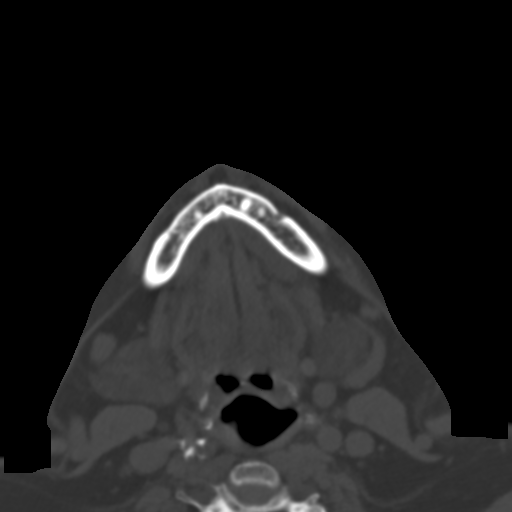
[im 25/90  bone]
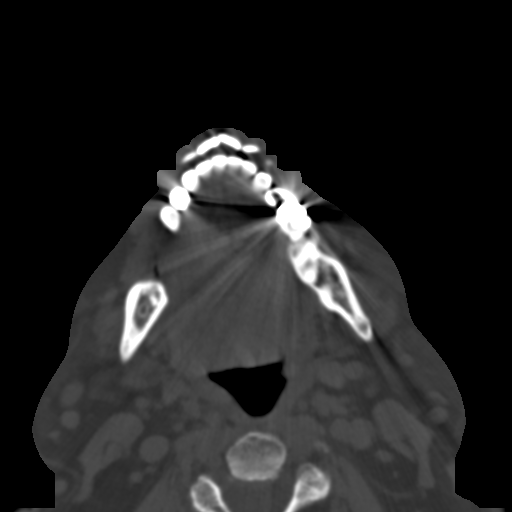
[im 34/90  bone]
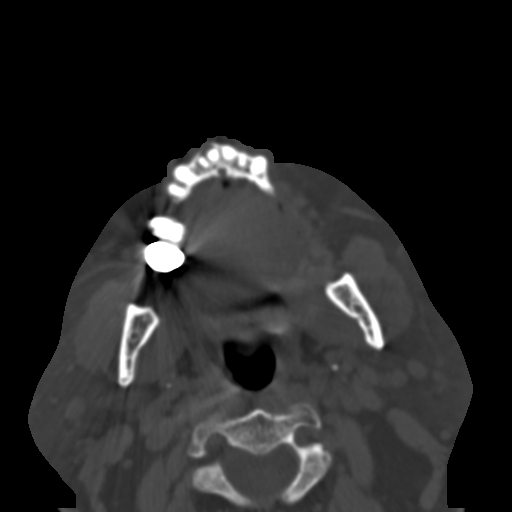
[im 47/90  brain]
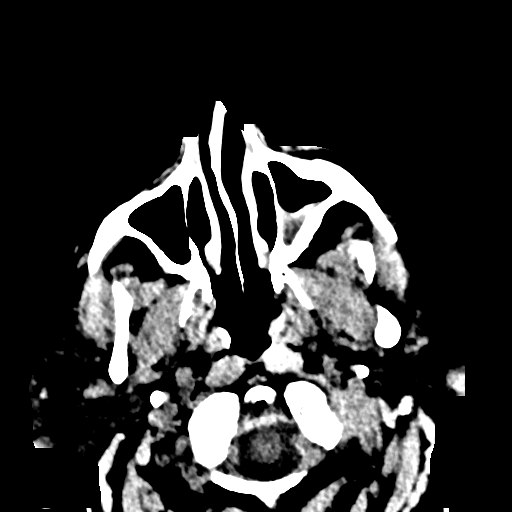
[im 47/90  bone]
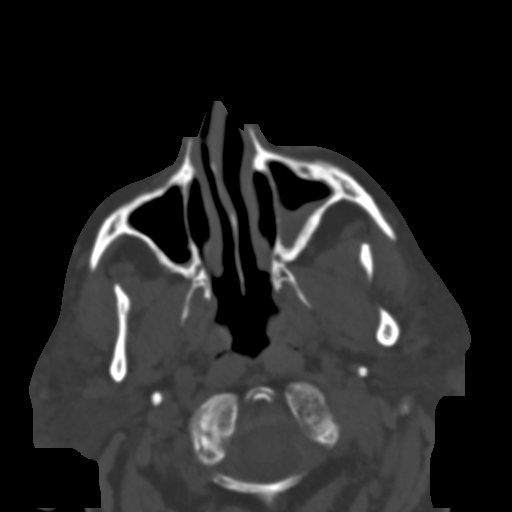
[im 56/90  bone]
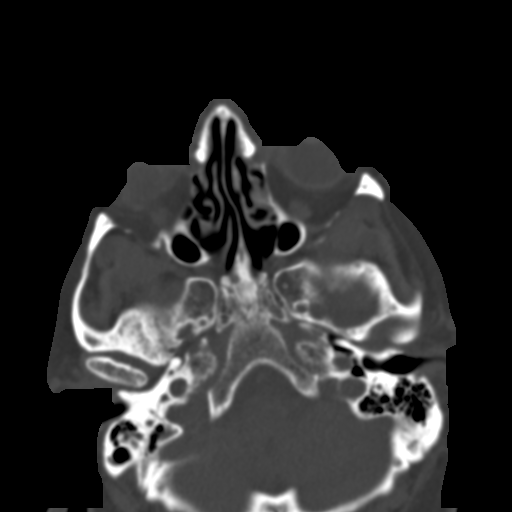
[im 65/90  bone]
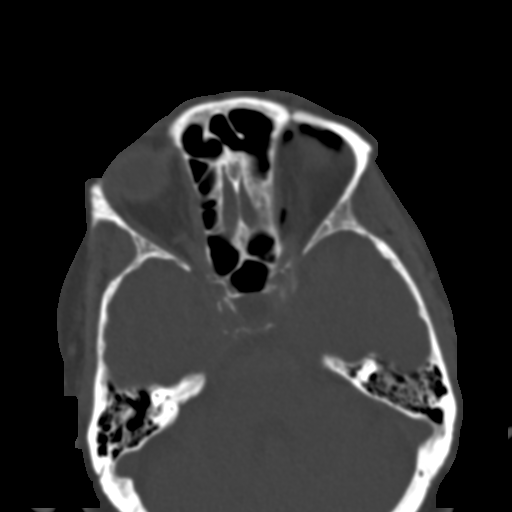
[im 74/90  bone]
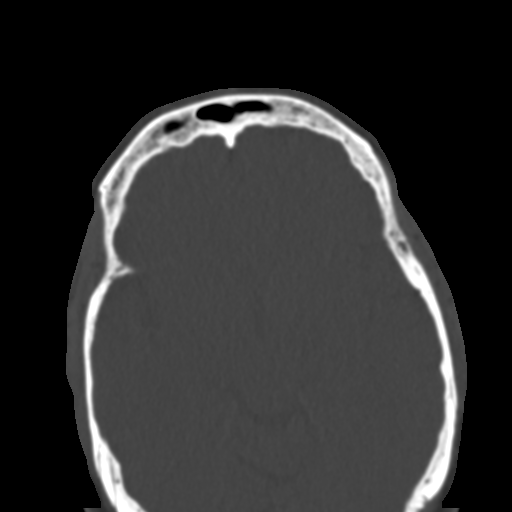
[im 83/90  brain]
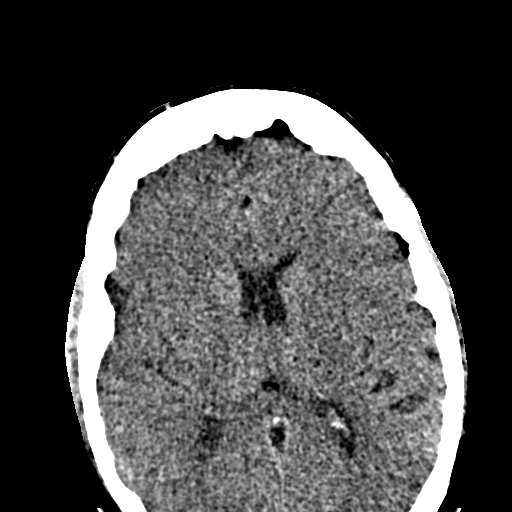
[im 83/90  bone]
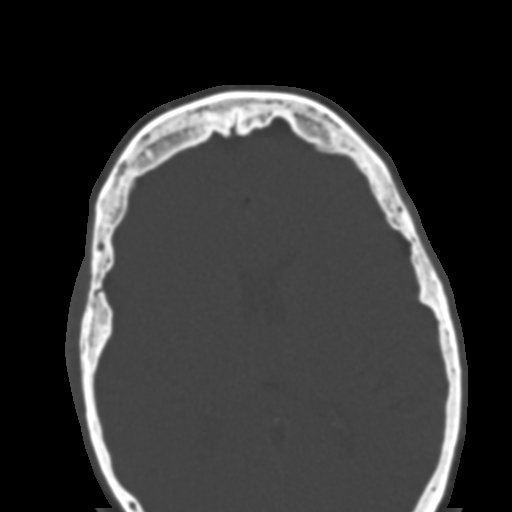

[Series 7: sagittal soft · sagittal · 0.34mm/px · 3 of 103 slices shown]
[im 35/103  bone]
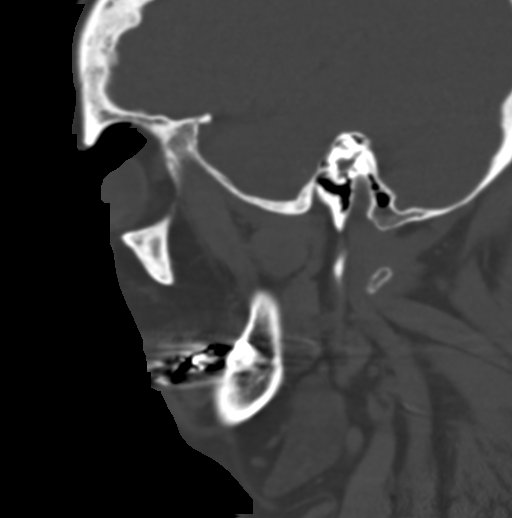
[im 52/103  bone]
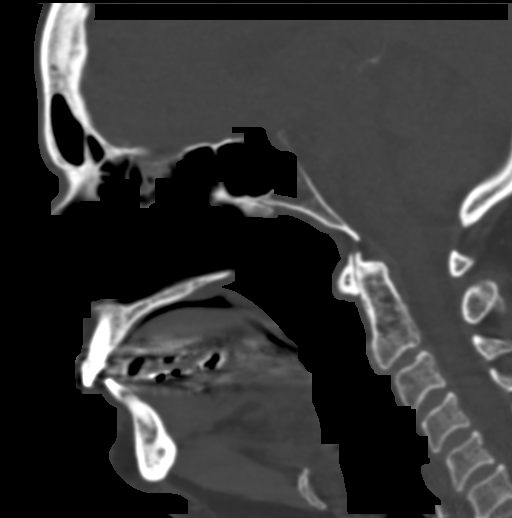
[im 69/103  bone]
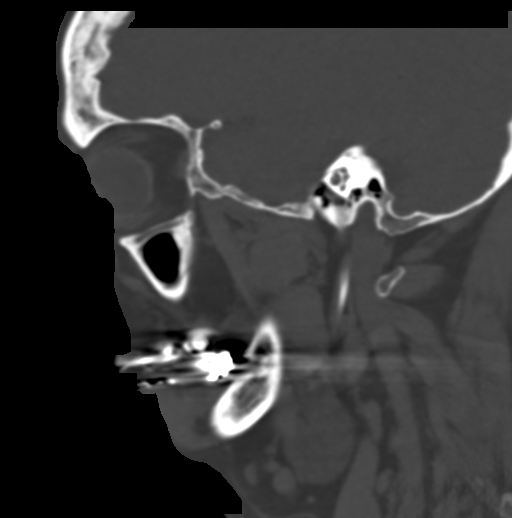

[Series 8: coronal bone · coronal · 0.35mm/px · 3 of 74 slices shown]
[im 19/74  bone]
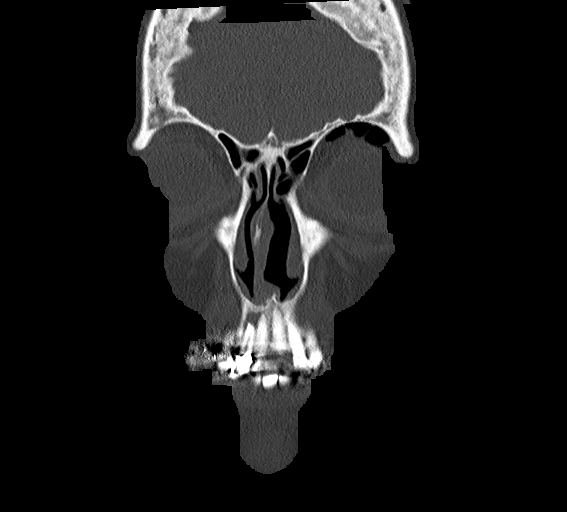
[im 37/74  bone]
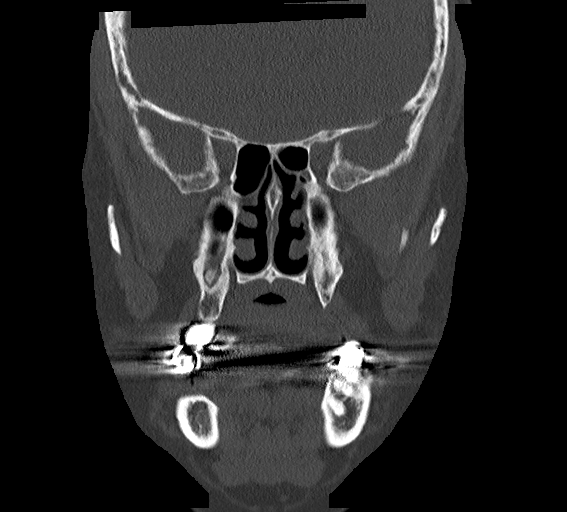
[im 55/74  bone]
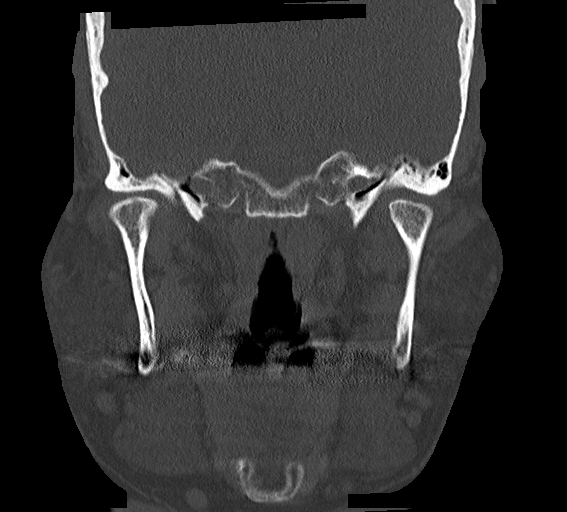

[15 of 47 positions shown; findings below may reference images not displayed]

FINDINGS: CT HEAD FINDINGS

Brain: The ventricles and sulci appropriate size for patient's age.
The gray-white matter discrimination is preserved. There is no acute
intracranial hemorrhage. No mass effect or midline shift. No
extra-axial fluid collection.

Vascular: No hyperdense vessel or unexpected calcification.

Skull: Normal. Negative for fracture or focal lesion.

Other: None

CT MAXILLOFACIAL FINDINGS

Osseous: Depressed fractures of the left lamina Propecia. No other
acute fracture. No mandibular subluxation.

Orbits: The globes are intact. There is left orbital and palpebral
emphysema. There is abutment of the medial rectus muscle of the left
orbit to the fracture of the lamina Propecia. Correlation with
clinical exam is recommended to exclude ocular entrapment.

Sinuses: Partial opacification of the left maxillary sinus, likely
hemosinus. There is mild diffuse mucoperiosteal thickening of
paranasal sinuses. The mastoid air cells are clear.

Soft tissues: Left periorbital contusion. No fluid collection or
large hematoma.
IMPRESSION: 1. No acute intracranial pathology.
2. Depressed fractures of the left lamina Propecia. Correlation with
clinical exam is recommended to exclude ocular entrapment.
